# Patient Record
Sex: Male | Born: 1970 | Race: White | Hispanic: No | Marital: Single | Smoking: Never smoker
Health system: Southern US, Community
[De-identification: ages and names within clinical notes are randomized; demographics above are authoritative.]

## PROBLEM LIST (undated history)

## (undated) HISTORY — PX: WISDOM TOOTH EXTRACTION: SHX21

---

## 2017-04-23 ENCOUNTER — Encounter: Payer: Self-pay | Admitting: Emergency Medicine

## 2017-04-23 ENCOUNTER — Emergency Department: Payer: Self-pay

## 2017-04-23 ENCOUNTER — Emergency Department
Admission: EM | Admit: 2017-04-23 | Discharge: 2017-04-23 | Disposition: A | Discharge status: Home / Self Care | Payer: Self-pay | Attending: Emergency Medicine | Admitting: Emergency Medicine

## 2017-04-23 ENCOUNTER — Other Ambulatory Visit: Payer: Self-pay

## 2017-04-23 DIAGNOSIS — R112 Nausea with vomiting, unspecified: Secondary | ICD-10-CM | POA: Insufficient documentation

## 2017-04-23 DIAGNOSIS — R739 Hyperglycemia, unspecified: Secondary | ICD-10-CM | POA: Insufficient documentation

## 2017-04-23 DIAGNOSIS — K2900 Acute gastritis without bleeding: Secondary | ICD-10-CM | POA: Insufficient documentation

## 2017-04-23 DIAGNOSIS — R1010 Upper abdominal pain, unspecified: Secondary | ICD-10-CM

## 2017-04-23 LAB — URINALYSIS, COMPLETE (UACMP) WITH MICROSCOPIC
BACTERIA UA: NONE SEEN
Bilirubin Urine: NEGATIVE
Glucose, UA: 150 mg/dL — AB
Hgb urine dipstick: NEGATIVE
Ketones, ur: 80 mg/dL — AB
Leukocytes, UA: NEGATIVE
NITRITE: NEGATIVE
Protein, ur: 100 mg/dL — AB
SPECIFIC GRAVITY, URINE: 1.034 — AB (ref 1.005–1.030)
pH: 5 (ref 5.0–8.0)

## 2017-04-23 LAB — COMPREHENSIVE METABOLIC PANEL
ALBUMIN: 4.4 g/dL (ref 3.5–5.0)
ALK PHOS: 71 U/L (ref 38–126)
ALT: 36 U/L (ref 17–63)
AST: 29 U/L (ref 15–41)
Anion gap: 15 (ref 5–15)
BILIRUBIN TOTAL: 1.4 mg/dL — AB (ref 0.3–1.2)
BUN: 25 mg/dL — AB (ref 6–20)
CO2: 25 mmol/L (ref 22–32)
CREATININE: 1 mg/dL (ref 0.61–1.24)
Calcium: 9.6 mg/dL (ref 8.9–10.3)
Chloride: 98 mmol/L — ABNORMAL LOW (ref 101–111)
GFR calc Af Amer: >60 mL/min (ref >60)
GFR calc non Af Amer: >60 mL/min (ref >60)
GLUCOSE: 225 mg/dL — AB (ref 65–99)
POTASSIUM: 4 mmol/L (ref 3.5–5.1)
Sodium: 138 mmol/L (ref 135–145)
TOTAL PROTEIN: 8.1 g/dL (ref 6.5–8.1)

## 2017-04-23 LAB — CBC
HEMATOCRIT: 53 % — AB (ref 40.0–52.0)
Hemoglobin: 18.2 g/dL — ABNORMAL HIGH (ref 13.0–18.0)
MCH: 30.6 pg (ref 26.0–34.0)
MCHC: 34.3 g/dL (ref 32.0–36.0)
MCV: 89.1 fL (ref 80.0–100.0)
Platelets: 247 10*3/uL (ref 150–440)
RBC: 5.94 MIL/uL — ABNORMAL HIGH (ref 4.40–5.90)
RDW: 12.9 % (ref 11.5–14.5)
WBC: 12.7 10*3/uL — ABNORMAL HIGH (ref 3.8–10.6)

## 2017-04-23 LAB — LIPASE, BLOOD: Lipase: 35 U/L (ref 11–51)

## 2017-04-23 LAB — TROPONIN I

## 2017-04-23 MED ORDER — GI COCKTAIL ~~LOC~~
30.0000 mL | Freq: Once | ORAL | Status: AC
  Administered 2017-04-23: 30 mL via ORAL
  Filled 2017-04-23: qty 30

## 2017-04-23 MED ORDER — ONDANSETRON HCL 4 MG/2ML IJ SOLN
4.0000 mg | Freq: Once | INTRAMUSCULAR | Status: AC
  Administered 2017-04-23: 4 mg via INTRAVENOUS
  Filled 2017-04-23: qty 2

## 2017-04-23 MED ORDER — SODIUM CHLORIDE 0.9 % IV BOLUS
1000.0000 mL | Freq: Once | INTRAVENOUS | Status: AC
  Administered 2017-04-23: 1000 mL via INTRAVENOUS

## 2017-04-23 MED ORDER — IOPAMIDOL (ISOVUE-300) INJECTION 61%
30.0000 mL | Freq: Once | INTRAVENOUS | Status: AC
  Administered 2017-04-23: 30 mL via ORAL

## 2017-04-23 MED ORDER — PANTOPRAZOLE SODIUM 40 MG IV SOLR
40.0000 mg | Freq: Once | INTRAVENOUS | Status: AC
  Administered 2017-04-23: 40 mg via INTRAVENOUS
  Filled 2017-04-23: qty 40

## 2017-04-23 MED ORDER — IOPAMIDOL (ISOVUE-300) INJECTION 61%
100.0000 mL | Freq: Once | INTRAVENOUS | Status: AC | PRN
  Administered 2017-04-23: 100 mL via INTRAVENOUS

## 2017-04-23 MED ORDER — SUCRALFATE 1 G PO TABS: 1.0000 g | ORAL_TABLET | Freq: Four times a day (QID) | ORAL | 0 refills | Status: AC

## 2017-04-23 NOTE — ED Notes (Signed)
Pt to the ER for pain in the middle of the abdomen. Worse when laying down. Pt induces vomiting to relieve pain. No nausea or pain at this time. Holding zofran unless pt becomes nauseated. Urine is dark in color.

## 2017-04-23 NOTE — ED Notes (Signed)
Patient transported to CT 

## 2017-04-23 NOTE — ED Provider Notes (Signed)
[facility] Emergency Department Provider Note  ___________________________________________   First MD Initiated Contact with Patient 04/23/17 1915     (approximate)  I have reviewed the triage vital signs and the nursing notes.   HISTORY  Chief Complaint Abdominal Pain   HPI Bill Mcneil is a 47 y.o. male without any chronic medical conditions was presenting to the emergency department with 2-3 weeks of burning upper abdominal pain.  He says that since Friday though it is gotten much worse.  He states that he has not been able to eat anything since this past Friday needs to vomit about every 45 minutes in order to relieve the pressure he feels to his upper abdomen.  He says that he has been taking Nexium over the past 2-3 weeks without relief.  He says that he has been taking it every morning.  He says that he is also not had a bowel movement since this past Saturday but is passing gas as of today.  Says that he had a similar episode about 6 months ago but it resolved after about 1 week.  Says that he has not seen a doctor in years and does not take any medication at home.  Drinks alcohol occasionally but not over the past 2-3 weeks.  History reviewed. No pertinent past medical history.  There are no active problems to display for this patient.   Past Surgical History:  Procedure Laterality Date  . WISDOM TOOTH EXTRACTION      Prior to Admission medications   Not on File    Allergies Patient has no known allergies.  History reviewed. No pertinent family history.  Social History Social History   Tobacco Use  . Smoking status: Never Smoker  . Smokeless tobacco: Never Used  Substance Use Topics  . Alcohol use: Yes    Comment: twice a month  . Drug use: Never    Review of Systems  Constitutional: No fever/chills Eyes: No visual changes. ENT: No sore throat. Cardiovascular: Denies chest pain. Respiratory: Denies shortness of  breath. Gastrointestinal:  No diarrhea.  No constipation. Genitourinary: Negative for dysuria. Musculoskeletal: Negative for back pain. Skin: Negative for rash. Neurological: Negative for headaches, focal weakness or numbness.   ____________________________________________   PHYSICAL EXAM:  VITAL SIGNS: ED Triage Vitals [04/23/17 1716]  Enc Vitals Group     BP (!) 142/92     Pulse Rate (!) 112     Resp 18     Temp      Temp src      SpO2 98 %     Weight 195 lb (88.5 kg)     Height 5\' 10"  (1.778 m)     Head Circumference      Peak Flow      Pain Score 5     Pain Loc      Pain Edu?      Excl. in GC?     Constitutional: Alert and oriented. Well appearing and in no acute distress. Eyes: Conjunctivae are normal.  Head: Atraumatic. Nose: No congestion/rhinnorhea. Mouth/Throat: Mucous membranes are moist.  Neck: No stridor.   Cardiovascular: Normal rate, regular rhythm. Grossly normal heart sounds.  Respiratory: Normal respiratory effort.  No retractions. Lungs CTAB. Gastrointestinal: Soft and nontender. No distention. No CVA tenderness. Musculoskeletal: No lower extremity tenderness nor edema.  No joint effusions. Neurologic:  Normal speech and language. No gross focal neurologic deficits are appreciated. Skin:  Skin is warm, dry and intact. No rash  noted. Psychiatric: Mood and affect are normal. Speech and behavior are normal.  ____________________________________________   LABS (all labs ordered are listed, but only abnormal results are displayed)  Labs Reviewed  COMPREHENSIVE METABOLIC PANEL - Abnormal; Notable for the following components:      Result Value   Chloride 98 (*)    Glucose, Bld 225 (*)    BUN 25 (*)    Total Bilirubin 1.4 (*)    All other components within normal limits  CBC - Abnormal; Notable for the following components:   WBC 12.7 (*)    RBC 5.94 (*)    Hemoglobin 18.2 (*)    HCT 53.0 (*)    All other components within normal limits   URINALYSIS, COMPLETE (UACMP) WITH MICROSCOPIC - Abnormal; Notable for the following components:   Color, Urine AMBER (*)    APPearance HAZY (*)    Specific Gravity, Urine 1.034 (*)    Glucose, UA 150 (*)    Ketones, ur 80 (*)    Protein, ur 100 (*)    Squamous Epithelial / LPF 0-5 (*)    All other components within normal limits  LIPASE, BLOOD  TROPONIN I   ____________________________________________  EKG  ED ECG REPORT I, Arelia Longest, the attending physician, personally viewed and interpreted this ECG.   Date: 04/23/2017  EKG Time: 1716  Rate: 114  Rhythm: sinus tachycardia  Axis: Normal  Intervals:none  ST&T Change: No ST segment elevation or depression.  No abnormal T wave inversion.  ____________________________________________  RADIOLOGY  Slightly enlarged proximal appendix with more normal-appearing distal caliber without surrounding inflammatory changes to suggest appendicitis.  Hepatic steatosis with fat sparing near the gallbladder fossa ____________________________________________   PROCEDURES  Procedure(s) performed:   Procedures  Critical Care performed:   ____________________________________________   INITIAL IMPRESSION / ASSESSMENT AND PLAN / ED COURSE  Pertinent labs & imaging results that were available during my care of the patient were reviewed by me and considered in my medical decision making (see chart for details).  Differential diagnosis includes, but is not limited to, biliary disease (biliary colic, acute cholecystitis, cholangitis, choledocholithiasis, etc), intrathoracic causes for epigastric abdominal pain including ACS, gastritis, duodenitis, pancreatitis, small bowel or large bowel obstruction, abdominal aortic aneurysm, hernia, and gastritis. As part of my medical decision making, I reviewed the following data within the electronic MEDICAL RECORD NUMBERPatient without outpatient primary care.     ----------------------------------------- 10:51 PM on 04/23/2017 -----------------------------------------  Patient tolerated GI cocktail very well and says that he feels much improved after this medication.  Continues to be pain-free throughout.  I did discuss the case with Dr. Aleen Campi including the radiology report he says that there is unlikely to be any indication of acute appendicitis especially since the patient is nontender throughout.  Patient also reporting now that he took 800 mg of ibuprofen daily 3 days last week leading up to this issue.  Possible gastritis versus peptic ulcer disease.  Patient will continue his Nexium.  I will discharge him with sucralfate.  He will use Maalox or Mylanta as needed.  He will also be given follow-up with gastroneurology.  I am also concerned that he does not of primary care and has a glucose of 225.  Although this is not a formal fasting glucose I believe that he will need further workup including screening for diabetes.  Will be given references for several clinics in the area.  I discussed with him the importance of following up for regular  health screening especially since he is now getting into his late 69s.  He is understanding of this plan and willing to comply. ____________________________________________   FINAL CLINICAL IMPRESSION(S) / ED DIAGNOSES  Gastritis.  Nausea and vomiting.  Hyperglycemia.  Abdominal pain.    NEW MEDICATIONS STARTED DURING THIS VISIT:  New Prescriptions   No medications on file     Note:  This document was prepared using Dragon voice recognition software and may include unintentional dictation errors.     Myrna Blazer, MD 04/23/17 [phone]

## 2017-04-23 NOTE — ED Triage Notes (Signed)
Pt to ED c/o upper mid abd pain that is aching x3 days and only relief is when he makes himself vomit, vomited x10 today.  Passing gas, last BM Saturday, usually goes once a day.  Takes nexium 24 at home.

## 2017-04-26 ENCOUNTER — Telehealth: Payer: Self-pay | Admitting: Internal Medicine

## 2017-04-26 ENCOUNTER — Emergency Department
Admission: EM | Admit: 2017-04-26 | Discharge: 2017-04-26 | Disposition: A | Discharge status: Home / Self Care | Payer: Self-pay | Attending: Emergency Medicine | Admitting: Emergency Medicine

## 2017-04-26 ENCOUNTER — Encounter: Payer: Self-pay | Admitting: Emergency Medicine

## 2017-04-26 ENCOUNTER — Other Ambulatory Visit: Payer: Self-pay

## 2017-04-26 DIAGNOSIS — R1013 Epigastric pain: Secondary | ICD-10-CM

## 2017-04-26 DIAGNOSIS — K2970 Gastritis, unspecified, without bleeding: Secondary | ICD-10-CM | POA: Insufficient documentation

## 2017-04-26 LAB — CBC
HEMATOCRIT: 51.3 % (ref 40.0–52.0)
HEMOGLOBIN: 17.6 g/dL (ref 13.0–18.0)
MCH: 30.4 pg (ref 26.0–34.0)
MCHC: 34.4 g/dL (ref 32.0–36.0)
MCV: 88.4 fL (ref 80.0–100.0)
Platelets: 239 10*3/uL (ref 150–440)
RBC: 5.8 MIL/uL (ref 4.40–5.90)
RDW: 13.2 % (ref 11.5–14.5)
WBC: 12.8 10*3/uL — ABNORMAL HIGH (ref 3.8–10.6)

## 2017-04-26 LAB — COMPREHENSIVE METABOLIC PANEL
ALBUMIN: 4.1 g/dL (ref 3.5–5.0)
ALT: 32 U/L (ref 17–63)
ANION GAP: 10 (ref 5–15)
AST: 26 U/L (ref 15–41)
Alkaline Phosphatase: 58 U/L (ref 38–126)
BUN: 20 mg/dL (ref 6–20)
CHLORIDE: 99 mmol/L — AB (ref 101–111)
CO2: 27 mmol/L (ref 22–32)
Calcium: 9.1 mg/dL (ref 8.9–10.3)
Creatinine, Ser: 0.95 mg/dL (ref 0.61–1.24)
GFR calc Af Amer: >60 mL/min (ref >60)
GFR calc non Af Amer: >60 mL/min (ref >60)
GLUCOSE: 293 mg/dL — AB (ref 65–99)
POTASSIUM: 3.7 mmol/L (ref 3.5–5.1)
Sodium: 136 mmol/L (ref 135–145)
Total Bilirubin: 1 mg/dL (ref 0.3–1.2)
Total Protein: 7.5 g/dL (ref 6.5–8.1)

## 2017-04-26 LAB — LIPASE, BLOOD: LIPASE: 49 U/L (ref 11–51)

## 2017-04-26 MED ORDER — SODIUM CHLORIDE 0.9 % IV BOLUS
1000.0000 mL | Freq: Once | INTRAVENOUS | Status: AC
  Administered 2017-04-26: 1000 mL via INTRAVENOUS

## 2017-04-26 MED ORDER — PANTOPRAZOLE SODIUM 40 MG PO TBEC: 40.0000 mg | DELAYED_RELEASE_TABLET | Freq: Every day | ORAL | 1 refills | Status: AC

## 2017-04-26 MED ORDER — PANTOPRAZOLE SODIUM 40 MG PO TBEC
40.0000 mg | DELAYED_RELEASE_TABLET | Freq: Every day | ORAL | Status: DC
  Administered 2017-04-26: 40 mg via ORAL
  Filled 2017-04-26: qty 1

## 2017-04-26 MED ORDER — ONDANSETRON 4 MG PO TBDP: 4.0000 mg | ORAL_TABLET | Freq: Three times a day (TID) | ORAL | 0 refills | Status: AC | PRN

## 2017-04-26 MED ORDER — GI COCKTAIL ~~LOC~~
30.0000 mL | Freq: Once | ORAL | Status: AC
  Administered 2017-04-26: 30 mL via ORAL
  Filled 2017-04-26: qty 30

## 2017-04-26 NOTE — Telephone Encounter (Signed)
Patient's mother called to the hospital operator to discuss the patient's case with Dr. Maximino Greenlandahiliani, who will be patient's consultant after ED visit on 04/23/2017 referral. Being Friday afternoon, Dr. Maximino Greenlandahiliani is not available and I am the GI physician on call.  Patient has not yet seen Dr. Maximino Greenlandahiliani in the office nor in the hospital. The mother of the patient describes patient with intractable nausea and vomiting "unable to keep any food or liquids down".  NO reported bleeding. I referred the patient and his mother to the [facility] ED as soon as possible for re-evaluation and treatment. The mother was told to keep the patient's initial consultation appointment with Dr. Maximino Greenlandahiliani as scheduled otherwise.

## 2017-04-26 NOTE — ED Provider Notes (Signed)
[facility] Emergency Department Provider Note  Time seen: 7:47 PM  I have reviewed the triage vital signs and the nursing notes.   HISTORY  Chief Complaint Abdominal Pain    HPI Bill Mcneil is a 47 y.o. male with no significant past medical history presents to the emergency department for upper abdominal discomfort.  According to the patient for the past 1-2 weeks he has been experiencing upper abdominal pain.  Describes it as a stabbing type pain, worse when eating food, somewhat relieved after vomiting.  States nausea, states he only vomits when he makes himself vomit to help with the abdominal discomfort.  States he had 2 bowel movements today and felt considerably better after having the bowel movements.  Denies any fever.  Patient states he had similar episode back in November that lasted several days and then resolved.  Has never seen GI medicine has never had an endoscopy.  Patient was seen in the emergency department for the same 04/23/17.  At that time patient's workup was largely reassuring including an overall negative CT scan.   History reviewed. No pertinent past medical history.  There are no active problems to display for this patient.   Past Surgical History:  Procedure Laterality Date  . WISDOM TOOTH EXTRACTION      Prior to Admission medications   Medication Sig Start Date End Date Taking? Authorizing Provider  sucralfate (CARAFATE) 1 g tablet Take 1 tablet (1 g total) by mouth 4 (four) times daily for 15 doses. 04/23/17 04/27/17  Schaevitz, Myra Rude, MD    No Known Allergies  No family history on file.  Social History Social History   Tobacco Use  . Smoking status: Never Smoker  . Smokeless tobacco: Never Used  Substance Use Topics  . Alcohol use: Yes    Comment: twice a month  . Drug use: Never    Review of Systems Constitutional: Negative for fever. Eyes: Negative for visual complaints ENT: Negative for recent  illness/congestion Cardiovascular: Negative for chest pain. Respiratory: Negative for shortness of breath. Gastrointestinal: Moderate upper abdominal pain, mild currently. Genitourinary: Negative for urinary compaints Musculoskeletal: Negative for musculoskeletal complaints Skin: Negative for skin complaints  Neurological: Negative for headache All other ROS negative  ____________________________________________   PHYSICAL EXAM:  VITAL SIGNS: ED Triage Vitals  Enc Vitals Group     BP 04/26/17 1811 120/83     Pulse Rate 04/26/17 1811 (!) 109     Resp 04/26/17 1811 16     Temp 04/26/17 1811 99.4 F (37.4 C)     Temp Source 04/26/17 1811 Oral     SpO2 04/26/17 1811 99 %     Weight 04/26/17 1812 195 lb (88.5 kg)     Height 04/26/17 1812 5\' 10"  (1.778 m)     Head Circumference --      Peak Flow --      Pain Score 04/26/17 1830 4     Pain Loc --      Pain Edu? --      Excl. in GC? --     Constitutional: Alert and oriented. Well appearing and in no distress. Eyes: Normal exam ENT   Head: Normocephalic and atraumatic   Mouth/Throat: Mucous membranes are moist. Cardiovascular: Normal rate, regular rhythm. No murmur Respiratory: Normal respiratory effort without tachypnea nor retractions. Breath sounds are clear  Gastrointestinal: Soft, mild epigastric tenderness palpation.  No rebound or guarding.  No distention. Musculoskeletal: Nontender with normal range of motion in  all extremities. Neurologic:  Normal speech and language. No gross focal neurologic deficits Skin:  Skin is warm, dry and intact.  Psychiatric: Mood and affect are normal.   ____________________________________________    EKG  EKG reviewed and interpreted by myself shows normal sinus rhythm at 99 bpm, narrow QRS, normal axis, normal intervals, nonspecific ST changes without ST elevation.  ____________________________________________     INITIAL IMPRESSION / ASSESSMENT AND PLAN / ED  COURSE  Pertinent labs & imaging results that were available during my care of the patient were reviewed by me and considered in my medical decision making (see chart for details).  She presents emergency department for upper abdominal pain, worse after eating.  Differential would include gallbladder disease, gastritis, gastric or peptic ulcers, esophagitis.  Patient states he started taking Carafate yesterday but continued to have pain today, states he was very nauseated and could not drink fluids due to the nausea sensation so he did not drink anything today, feels dehydrated.  I reviewed the patient's workup from several days ago including a CT scan that was overall negative.  Patient has no lower abdominal discomfort all of his discomfort is in the epigastrium.  States is considerably relieved after having a bowel movement earlier today.  Patient has a slight leukocytosis, but largely unchanged from 2 days ago.  Overall believe the patient symptoms are most suggestive of gastritis.  I discussed with the patient the need to follow-up with GI medicine.  They actually called today to arrange for a follow-up appointment.  Patient is currently taking Carafate, we will add Protonix.  I discussed return precautions.  Will also discharged with a nausea medication.  Patient agreeable to this plan of care.  ____________________________________________   FINAL CLINICAL IMPRESSION(S) / ED DIAGNOSES  Kathrin PennerGastritis    Menaal Russum, MD 04/26/17 [phone]

## 2017-04-26 NOTE — ED Triage Notes (Signed)
Patient presents to the ED with epigastric pain x 1 week.  Patient reports constipation since Saturday and nausea and vomiting.  Patient was seen on Tuesday for the same.  Patient states, "the last meal I ate was Friday.  I think I'm probably dehydrated."  Patient reports vomiting x 3 in the past 24 hours.

## 2017-04-26 NOTE — ED Notes (Signed)
Pt reports 2 episodes of diarrhea since being at the ED, pt c/o pain in peri-umbilicus area since last visit Tuesday, pain relief with self induced vomiting

## 2017-04-26 NOTE — ED Notes (Signed)
ED Provider at bedside. 

## 2017-04-26 NOTE — ED Triage Notes (Signed)
FIRST NURSE NOTE-here for epigastric pain. Was here for same 2 nights ago. NAD.

## 2017-04-26 NOTE — Discharge Instructions (Addendum)
Please follow-up with GI medicine as soon as possible for further evaluation and consideration of endoscopy.  Return to the emergency department for any acute worsening of pain, or any other symptom personally concerning to yourself.

## 2017-05-29 ENCOUNTER — Ambulatory Visit: Payer: Self-pay | Admitting: Gastroenterology

## 2017-07-10 ENCOUNTER — Ambulatory Visit: Payer: Self-pay | Admitting: Gastroenterology

## 2018-01-22 ENCOUNTER — Other Ambulatory Visit: Payer: Self-pay

## 2018-01-22 DIAGNOSIS — J111 Influenza due to unidentified influenza virus with other respiratory manifestations: Secondary | ICD-10-CM | POA: Insufficient documentation

## 2018-01-22 DIAGNOSIS — E119 Type 2 diabetes mellitus without complications: Secondary | ICD-10-CM | POA: Insufficient documentation

## 2018-01-22 NOTE — ED Triage Notes (Addendum)
Pt arrives to ED via POV from home with c/o dizziness and lightheadedness since Saturday. Pt states s/x's started with cough, nausea, and vomiting and has been unable to tolerate PO intake. Pt denies CP or SHOB, no abdominal pain, unsure of fever.

## 2018-01-23 ENCOUNTER — Emergency Department
Admission: EM | Admit: 2018-01-23 | Discharge: 2018-01-23 | Disposition: A | Discharge status: Home / Self Care | Payer: Self-pay | Attending: Emergency Medicine | Admitting: Emergency Medicine

## 2018-01-23 ENCOUNTER — Emergency Department: Payer: Self-pay

## 2018-01-23 DIAGNOSIS — J101 Influenza due to other identified influenza virus with other respiratory manifestations: Secondary | ICD-10-CM

## 2018-01-23 DIAGNOSIS — E1169 Type 2 diabetes mellitus with other specified complication: Secondary | ICD-10-CM

## 2018-01-23 LAB — CBC
HCT: 54.4 % — ABNORMAL HIGH (ref 39.0–52.0)
Hemoglobin: 19.3 g/dL — ABNORMAL HIGH (ref 13.0–17.0)
MCH: 30.3 pg (ref 26.0–34.0)
MCHC: 35.5 g/dL (ref 30.0–36.0)
MCV: 85.3 fL (ref 80.0–100.0)
PLATELETS: 138 10*3/uL — AB (ref 150–400)
RBC: 6.38 MIL/uL — AB (ref 4.22–5.81)
RDW: 11.7 % (ref 11.5–15.5)
WBC: 6.2 10*3/uL (ref 4.0–10.5)
nRBC: 0 % (ref 0.0–0.2)

## 2018-01-23 LAB — BASIC METABOLIC PANEL
ANION GAP: 13 (ref 5–15)
BUN: 19 mg/dL (ref 6–20)
CHLORIDE: 92 mmol/L — AB (ref 98–111)
CO2: 23 mmol/L (ref 22–32)
Calcium: 8.6 mg/dL — ABNORMAL LOW (ref 8.9–10.3)
Creatinine, Ser: 0.9 mg/dL (ref 0.61–1.24)
GFR calc Af Amer: >60 mL/min (ref >60)
GLUCOSE: 284 mg/dL — AB (ref 70–99)
Potassium: 4.1 mmol/L (ref 3.5–5.1)
Sodium: 128 mmol/L — ABNORMAL LOW (ref 135–145)

## 2018-01-23 LAB — GLUCOSE, CAPILLARY: Glucose-Capillary: 190 mg/dL — ABNORMAL HIGH (ref 70–99)

## 2018-01-23 LAB — URINALYSIS, COMPLETE (UACMP) WITH MICROSCOPIC
BILIRUBIN URINE: NEGATIVE
Bacteria, UA: NONE SEEN
Glucose, UA: >=500 mg/dL — AB
HGB URINE DIPSTICK: NEGATIVE
Ketones, ur: 80 mg/dL — AB
LEUKOCYTES UA: NEGATIVE
Nitrite: NEGATIVE
Protein, ur: >=300 mg/dL — AB
SPECIFIC GRAVITY, URINE: 1.039 — AB (ref 1.005–1.030)
pH: 5 (ref 5.0–8.0)

## 2018-01-23 LAB — BLOOD GAS, VENOUS
ACID-BASE EXCESS: 2 mmol/L (ref 0.0–2.0)
Bicarbonate: 27.8 mmol/L (ref 20.0–28.0)
O2 SAT: 43.7 %
PCO2 VEN: 47 mmHg (ref 44.0–60.0)
Patient temperature: 37
pH, Ven: 7.38 (ref 7.250–7.430)
pO2, Ven: <31 mmHg — CL (ref 32.0–45.0)

## 2018-01-23 LAB — HEMOGLOBIN A1C
HEMOGLOBIN A1C: 11.5 % — AB (ref 4.8–5.6)
Mean Plasma Glucose: 283.35 mg/dL

## 2018-01-23 LAB — CK: Total CK: 71 U/L (ref 49–397)

## 2018-01-23 LAB — INFLUENZA PANEL BY PCR (TYPE A & B)
INFLAPCR: POSITIVE — AB
Influenza B By PCR: NEGATIVE

## 2018-01-23 MED ORDER — METFORMIN HCL 500 MG PO TABS
500.0000 mg | ORAL_TABLET | Freq: Once | ORAL | Status: AC
  Administered 2018-01-23: 500 mg via ORAL
  Filled 2018-01-23: qty 1

## 2018-01-23 MED ORDER — SODIUM CHLORIDE 0.9 % IV BOLUS
1000.0000 mL | Freq: Once | INTRAVENOUS | Status: AC
  Administered 2018-01-23: 1000 mL via INTRAVENOUS

## 2018-01-23 MED ORDER — METFORMIN HCL 500 MG PO TABS: 500.0000 mg | ORAL_TABLET | Freq: Two times a day (BID) | ORAL | 0 refills | Status: AC

## 2018-01-23 NOTE — ED Provider Notes (Signed)
[facility] Emergency Department Provider Note   First MD Initiated Contact with Patient 01/23/18 0301     (approximate)  I have reviewed the triage vital signs and the nursing notes.   HISTORY  Chief Complaint Dizziness   HPI Bill Mcneil is a 48 y.o. male presents to the emergency department with subjective fevers cough nausea vomiting with dizziness/lightheadedness with position change since Saturday.  Patient does admit to increasing thirst.  Patient denies any abdominal pain.  Patient denies any chest pain or shortness of breath.   Past medical history None There are no active problems to display for this patient.   Past Surgical History:  Procedure Laterality Date  . WISDOM TOOTH EXTRACTION      Prior to Admission medications   Medication Sig Start Date End Date Taking? Authorizing Provider  metFORMIN (GLUCOPHAGE) 500 MG tablet Take 1 tablet (500 mg total) by mouth 2 (two) times daily with a meal. 01/23/18 01/23/19  Darci CurrentBrown, [place] N, MD  ondansetron (ZOFRAN ODT) 4 MG disintegrating tablet Take 1 tablet (4 mg total) by mouth every 8 (eight) hours as needed for nausea or vomiting. 04/26/17   Minna AntisPaduchowski, Kevin, MD  pantoprazole (PROTONIX) 40 MG tablet Take 1 tablet (40 mg total) by mouth daily. 04/26/17 04/26/18  Minna AntisPaduchowski, Kevin, MD  sucralfate (CARAFATE) 1 g tablet Take 1 tablet (1 g total) by mouth 4 (four) times daily for 15 doses. 04/23/17 04/27/17  Schaevitz, Myra Rudeavid Matthew, MD    Allergies No known drug allergies No family history on file.  Social History Social History   Tobacco Use  . Smoking status: Never Smoker  . Smokeless tobacco: Never Used  Substance Use Topics  . Alcohol use: Yes    Comment: twice a month  . Drug use: Never    Review of Systems Constitutional: Positive for fevers and chills. Eyes: No visual changes. ENT: No sore throat. Cardiovascular: Denies chest pain. Respiratory: Denies shortness of  breath. Gastrointestinal: Positive for nausea vomiting Genitourinary: Negative for dysuria. Musculoskeletal: Negative for neck pain.  Negative for back pain. Integumentary: Negative for rash. Neurological: Negative for headaches, focal weakness or numbness.  Positive for dizziness   ____________________________________________   PHYSICAL EXAM:  VITAL SIGNS: ED Triage Vitals  Enc Vitals Group     BP 01/22/18 2333 133/89     Pulse Rate 01/22/18 2333 (!) 115     Resp 01/22/18 2333 18     Temp 01/22/18 2333 98.9 F (37.2 C)     Temp Source 01/22/18 2333 Oral     SpO2 01/22/18 2333 97 %     Weight 01/22/18 2330 90.7 kg (200 lb)     Height 01/22/18 2330 1.778 m (5\' 10" )     Head Circumference --      Peak Flow --      Pain Score 01/22/18 2330 0     Pain Loc --      Pain Edu? --      Excl. in GC? --     Constitutional: Alert and oriented. Well appearing and in no acute distress. Eyes: Conjunctivae are normal. PERRL. EOMI. Mouth/Throat: Mucous membranes are dry.  Oropharynx non-erythematous. Neck: No stridor.   Cardiovascular: Normal rate, regular rhythm. Good peripheral circulation. Grossly normal heart sounds. Respiratory: Normal respiratory effort.  No retractions. Lungs CTAB. Gastrointestinal: Soft and nontender. No distention.  Musculoskeletal: No lower extremity tenderness nor edema. No gross deformities of extremities. Neurologic:  Normal speech and language. No gross focal neurologic  deficits are appreciated.  Skin:  Skin is warm, dry and intact. No rash noted. Psychiatric: Mood and affect are normal. Speech and behavior are normal.  ____________________________________________   LABS (all labs ordered are listed, but only abnormal results are displayed)  Labs Reviewed  BASIC METABOLIC PANEL - Abnormal; Notable for the following components:      Result Value   Sodium 128 (*)    Chloride 92 (*)    Glucose, Bld 284 (*)    Calcium 8.6 (*)    All other components  within normal limits  CBC - Abnormal; Notable for the following components:   RBC 6.38 (*)    Hemoglobin 19.3 (*)    HCT 54.4 (*)    Platelets 138 (*)    All other components within normal limits  URINALYSIS, COMPLETE (UACMP) WITH MICROSCOPIC - Abnormal; Notable for the following components:   Color, Urine AMBER (*)    APPearance HAZY (*)    Specific Gravity, Urine 1.039 (*)    Glucose, UA >=500 (*)    Ketones, ur 80 (*)    Protein, ur >=300 (*)    All other components within normal limits  INFLUENZA PANEL BY PCR (TYPE A & B) - Abnormal; Notable for the following components:   Influenza A By PCR POSITIVE (*)    All other components within normal limits  BLOOD GAS, VENOUS - Abnormal; Notable for the following components:   pO2, Ven <31.0 (*)    All other components within normal limits  HEMOGLOBIN A1C - Abnormal; Notable for the following components:   Hgb A1c MFr Bld 11.5 (*)    All other components within normal limits  GLUCOSE, CAPILLARY - Abnormal; Notable for the following components:   Glucose-Capillary 190 (*)    All other components within normal limits  CK  CBG MONITORING, ED   ____________________________________________  EKG  ED ECG REPORT I, Kenneth N Kathlean Cinco, the attending physician, personally viewed and interpreted this ECG.   Date: 01/22/2018  EKG Time: 11:45 PM  Rate: 114  Rhythm: Sinus tachycardia  Axis: Normal  Intervals: Normal  ST&T Change: None  ____________________________________________  RADIOLOGY I, [place] N Faustino Luecke, personally viewed and evaluated these images (plain radiographs) as part of my medical decision making, as well as reviewing the written report by the radiologist.  ED MD interpretation: Patchy peripheral midlung and basilar opacities concerning for mild pneumonia per radiologist.  Official radiology report(s): Dg Chest 2 View  Result Date: 01/23/2018 CLINICAL DATA:  Acute onset of dizziness and lightheadedness. Cough, nausea  and vomiting. EXAM: CHEST - 2 VIEW COMPARISON:  None. FINDINGS: The lungs are mildly hypoexpanded. Patchy peripheral left midlung and left basilar airspace opacities are concerning for mild pneumonia. There is no evidence of pleural effusion or pneumothorax. The heart is normal in size; the mediastinal contour is within normal limits. No acute osseous abnormalities are seen. IMPRESSION: Lungs mildly hypoexpanded. Patchy peripheral left midlung and left basilar airspace opacities are concerning for mild pneumonia. Electronically Signed   By: Roanna Raider M.D.   On: 01/23/2018 03:31     Procedures   ____________________________________________   INITIAL IMPRESSION / ASSESSMENT AND PLAN / ED COURSE  As part of my medical decision making, I reviewed the following data within the electronic MEDICAL RECORD NUMBER  48 year old male presenting with above-stated history and physical exam concerning for possible influenza for which the patient was positive _for influenza A.  Also concern for possible diabetes as reviewed the patient's chart  revealed persistently elevated glucose over 200.  Patient's glucose today 184.  Patient received 2 L IV normal saline and metformin.  Patient be prescribed metformin for home.  Spoke with the patient at length regarding management for home.  ____________________________  FINAL CLINICAL IMPRESSION(S) / ED DIAGNOSES  Final diagnoses:  Influenza A  Type 2 diabetes mellitus with other specified complication, without long-term current use of insulin (HCC)     MEDICATIONS GIVEN DURING THIS VISIT:  Medications  sodium chloride 0.9 % bolus 1,000 mL (0 mLs Intravenous Stopped 01/23/18 0435)  sodium chloride 0.9 % bolus 1,000 mL (0 mLs Intravenous Stopped 01/23/18 0400)  metFORMIN (GLUCOPHAGE) tablet 500 mg (500 mg Oral Given 01/23/18 0434)     ED Discharge Orders         Ordered    metFORMIN (GLUCOPHAGE) 500 MG tablet  2 times daily with meals     01/23/18 0537            Note:  This document was prepared using Dragon voice recognition software and may include unintentional dictation errors.    Darci CurrentBrown, [place] N, MD 01/23/18 [phone]

## 2018-01-27 ENCOUNTER — Telehealth: Payer: Self-pay | Admitting: Emergency Medicine

## 2018-01-27 NOTE — Telephone Encounter (Signed)
Called patient to inform of H1C result and ask about his follow up plans.  Left message.

## 2019-11-27 IMAGING — CT CT ABD-PELV W/ CM
2 of 5 series · 16 of 46 positions shown, 18 images · IV contrast (APPLIED)
Comparison: None.

CLINICAL DATA: Upper mid abdominal pain

EXAM:
CT ABDOMEN AND PELVIS WITH CONTRAST
TECHNIQUE: Multidetector CT imaging of the abdomen and pelvis was performed
using the standard protocol following bolus administration of
intravenous contrast.
CONTRAST:  100mL 10ULIA-088 IOPAMIDOL (10ULIA-088) INJECTION 61%

[Series 2: axial st · axial · 0.91mm/px · z∈[-996,-526]mm · 13 of 106 slices shown, 15 images]
[im 6/106  soft-tissue]
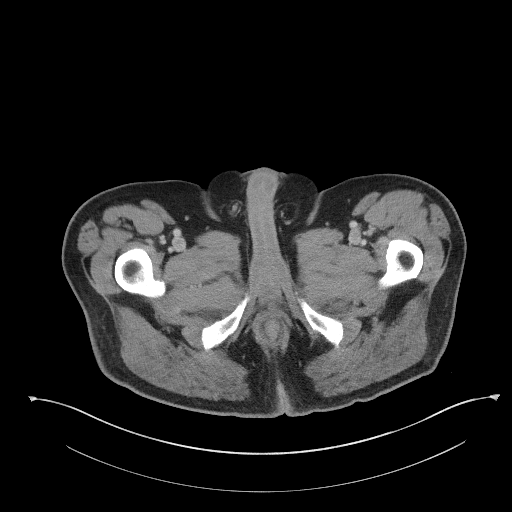
[im 6/106  bone]
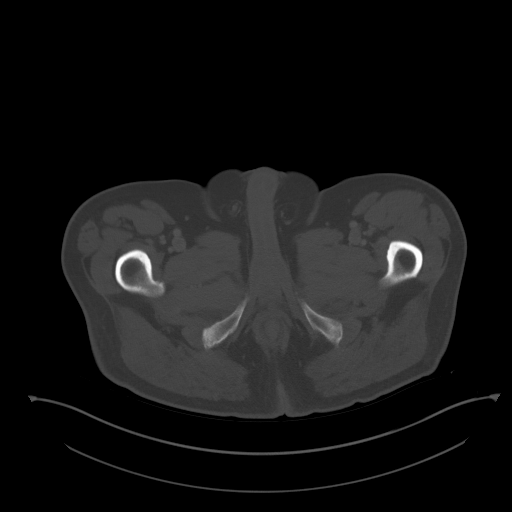
[im 16/106  soft-tissue]
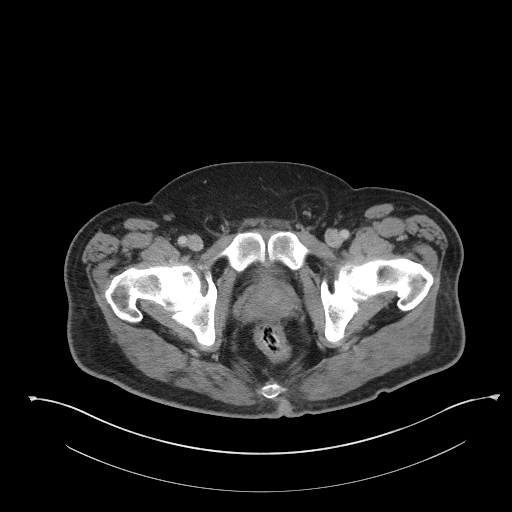
[im 22/106  soft-tissue]
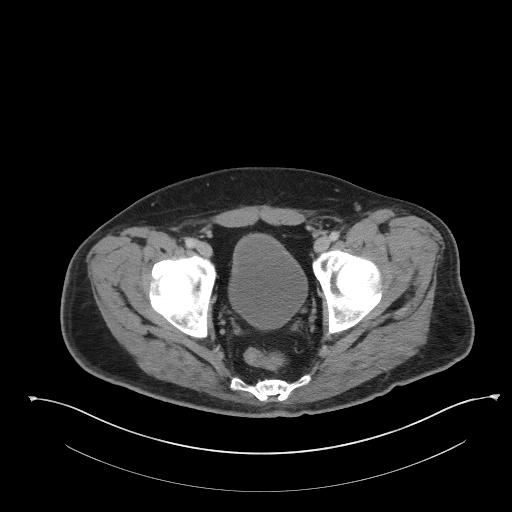
[im 32/106  soft-tissue]
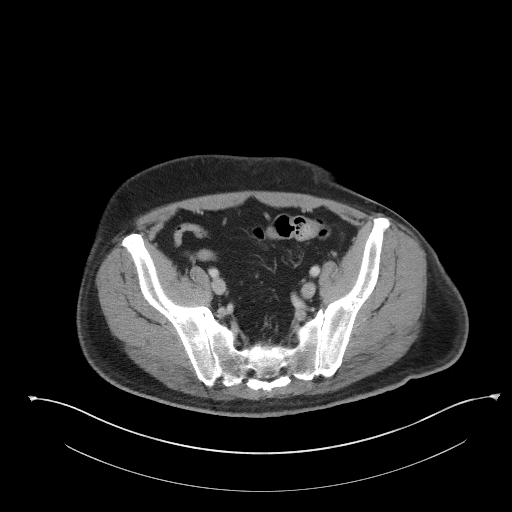
[im 37/106  soft-tissue]
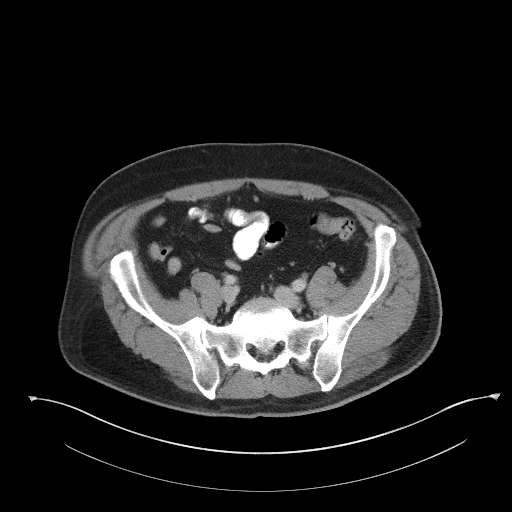
[im 48/106  soft-tissue]
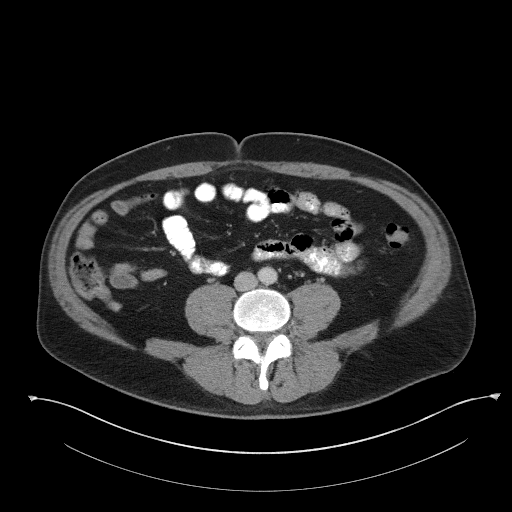
[im 53/106  soft-tissue]
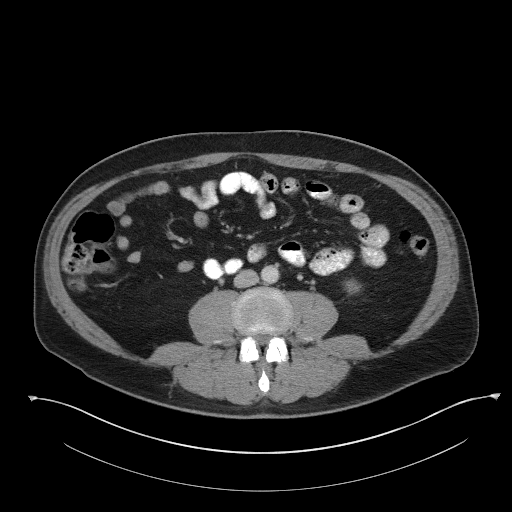
[im 58/106  soft-tissue]
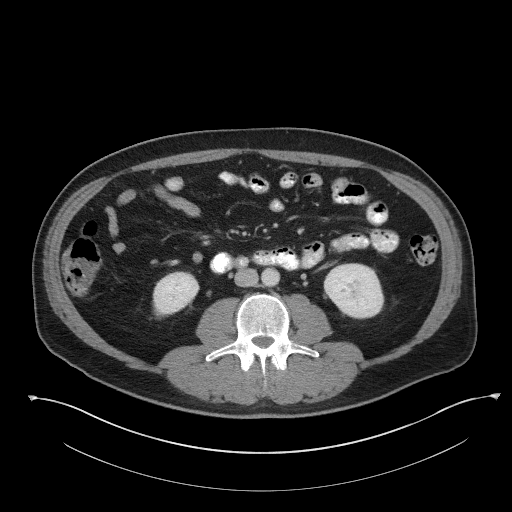
[im 69/106  soft-tissue]
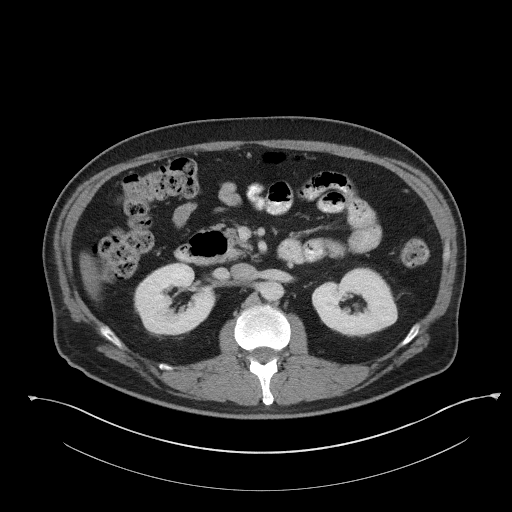
[im 69/106  bone]
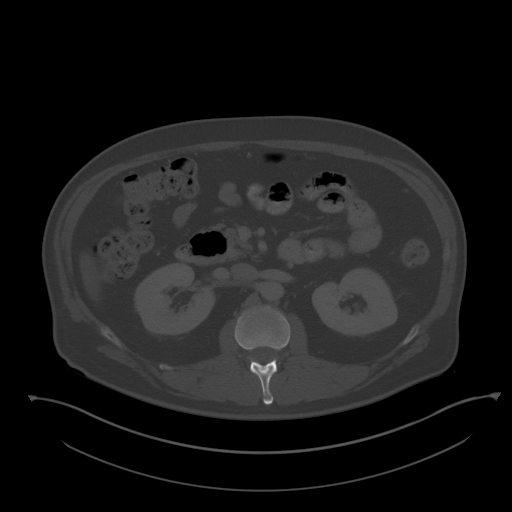
[im 74/106  soft-tissue]
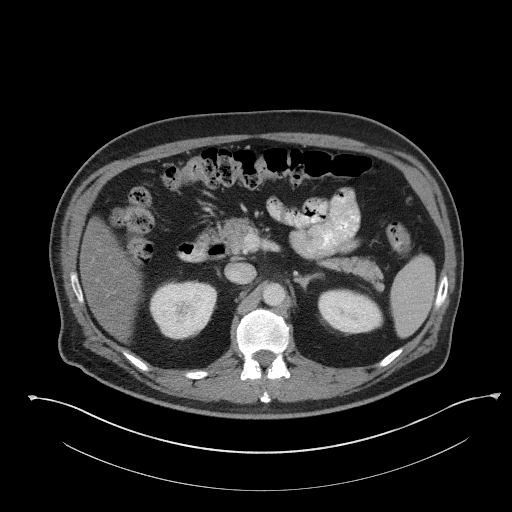
[im 85/106  soft-tissue]
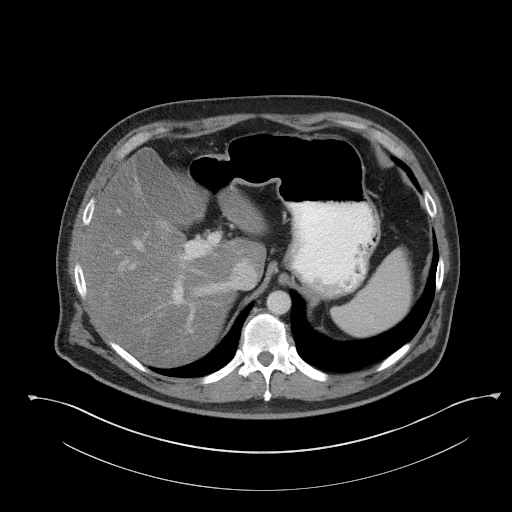
[im 90/106  soft-tissue]
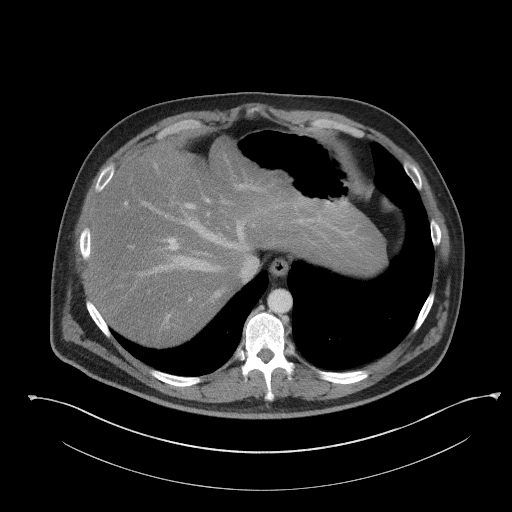
[im 100/106  soft-tissue]
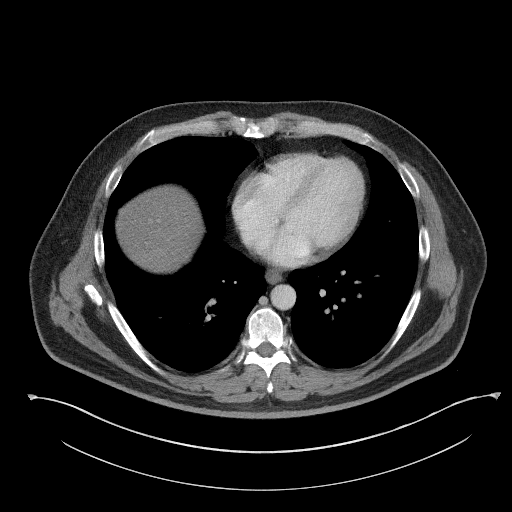

[Series 5: coronal st · coronal · 0.85mm/px · 3 of 97 slices shown]
[im 33/97  soft-tissue]
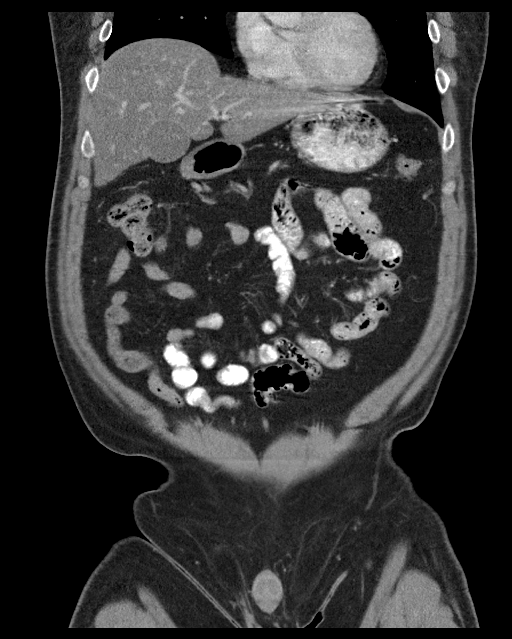
[im 43/97  soft-tissue]
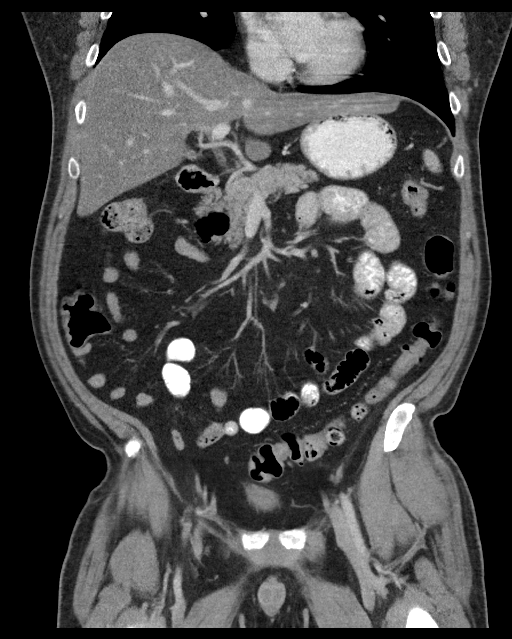
[im 54/97  soft-tissue]
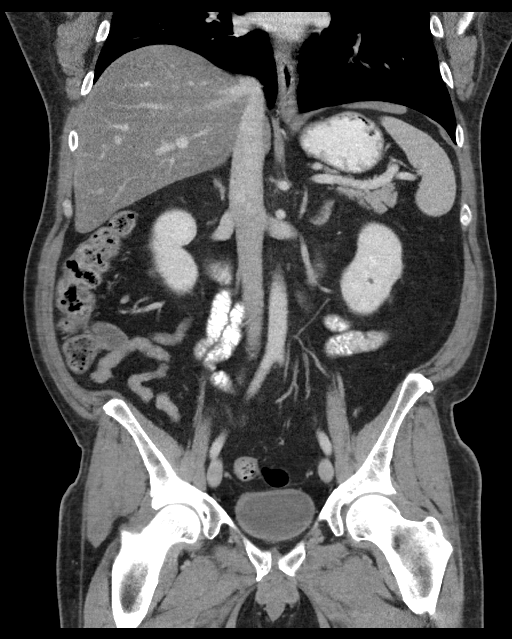

[16 of 46 positions shown; findings below may reference images not displayed]

FINDINGS: Lower chest: Lung bases demonstrate no acute consolidation or
pleural effusion. Normal heart size.

Hepatobiliary: Hepatic steatosis. No calcified gallstones. Fatty
sparing near the gallbladder fossa. No biliary dilatation

Pancreas: Unremarkable. No pancreatic ductal dilatation or
surrounding inflammatory changes.

Spleen: Normal in size without focal abnormality.

Adrenals/Urinary Tract: Adrenal glands are unremarkable. Kidneys are
normal, without renal calculi, focal lesion, or hydronephrosis.
Bladder is unremarkable.

Stomach/Bowel: Stomach is within normal limits. Proximal appendix
slightly enlarged, measuring up to 9 mm, distal appendix more normal
in caliber. No surrounding inflammatory changes. No evidence of
bowel wall thickening, distention, or inflammatory changes.

Vascular/Lymphatic: No significant vascular findings are present. No
enlarged abdominal or pelvic lymph nodes.

Reproductive: Prostate is unremarkable.

Other: Negative for free air or free fluid. Fat in the inguinal
canals bilaterally.

Musculoskeletal: No acute or significant osseous findings.
IMPRESSION: 1. Slightly enlarged proximal appendix with more normal appearing
distal caliber; no surrounding inflammatory changes to suggest
appendicitis.
2. Hepatic steatosis with fat sparing near the gallbladder fossa

## 2020-08-28 IMAGING — CR DG CHEST 2V
2 series · 2 of 2 positions shown · non-contrast
Comparison: None.

CLINICAL DATA: Acute onset of dizziness and lightheadedness. Cough,
nausea and vomiting.

EXAM:
CHEST - 2 VIEW

[chest lat]
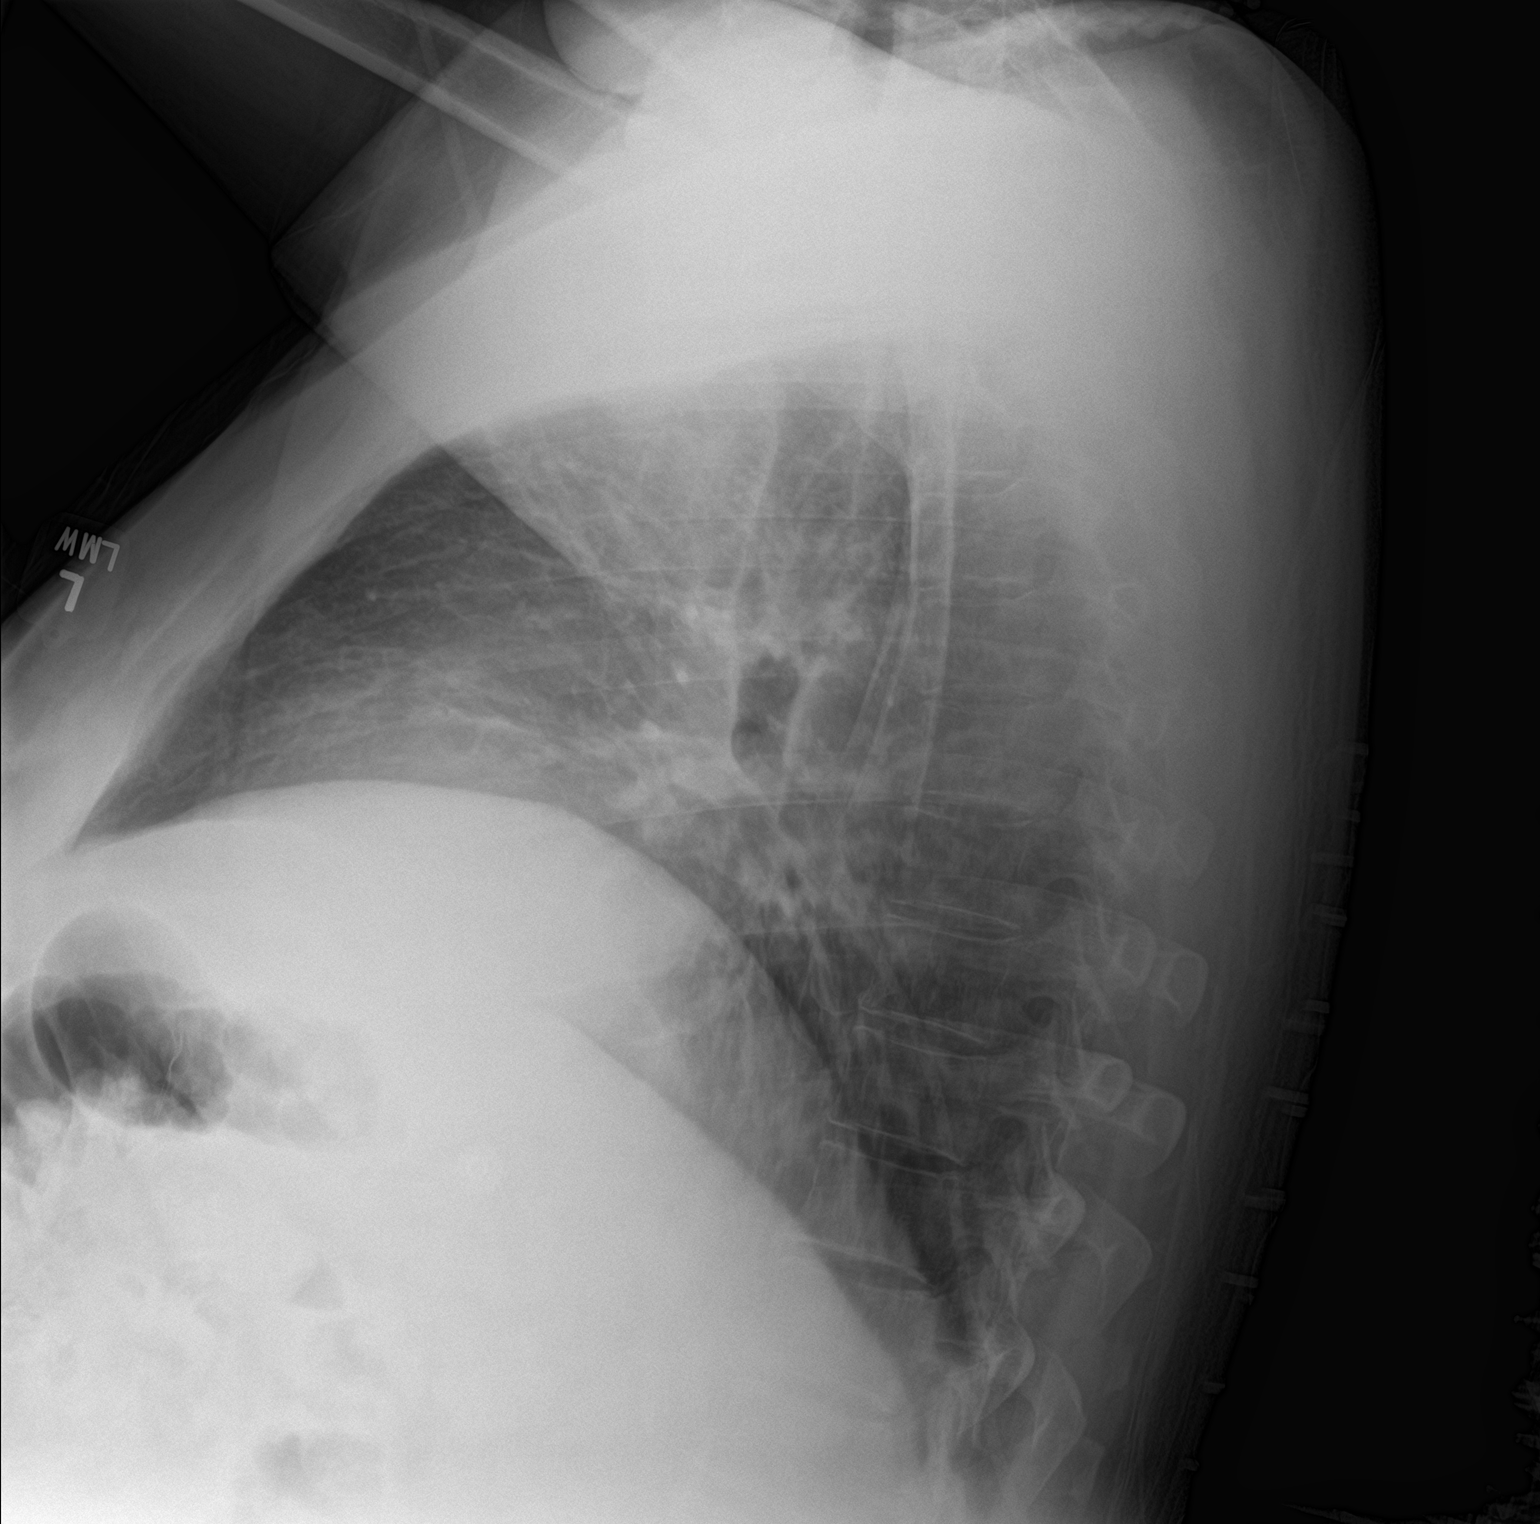

[chest ap]
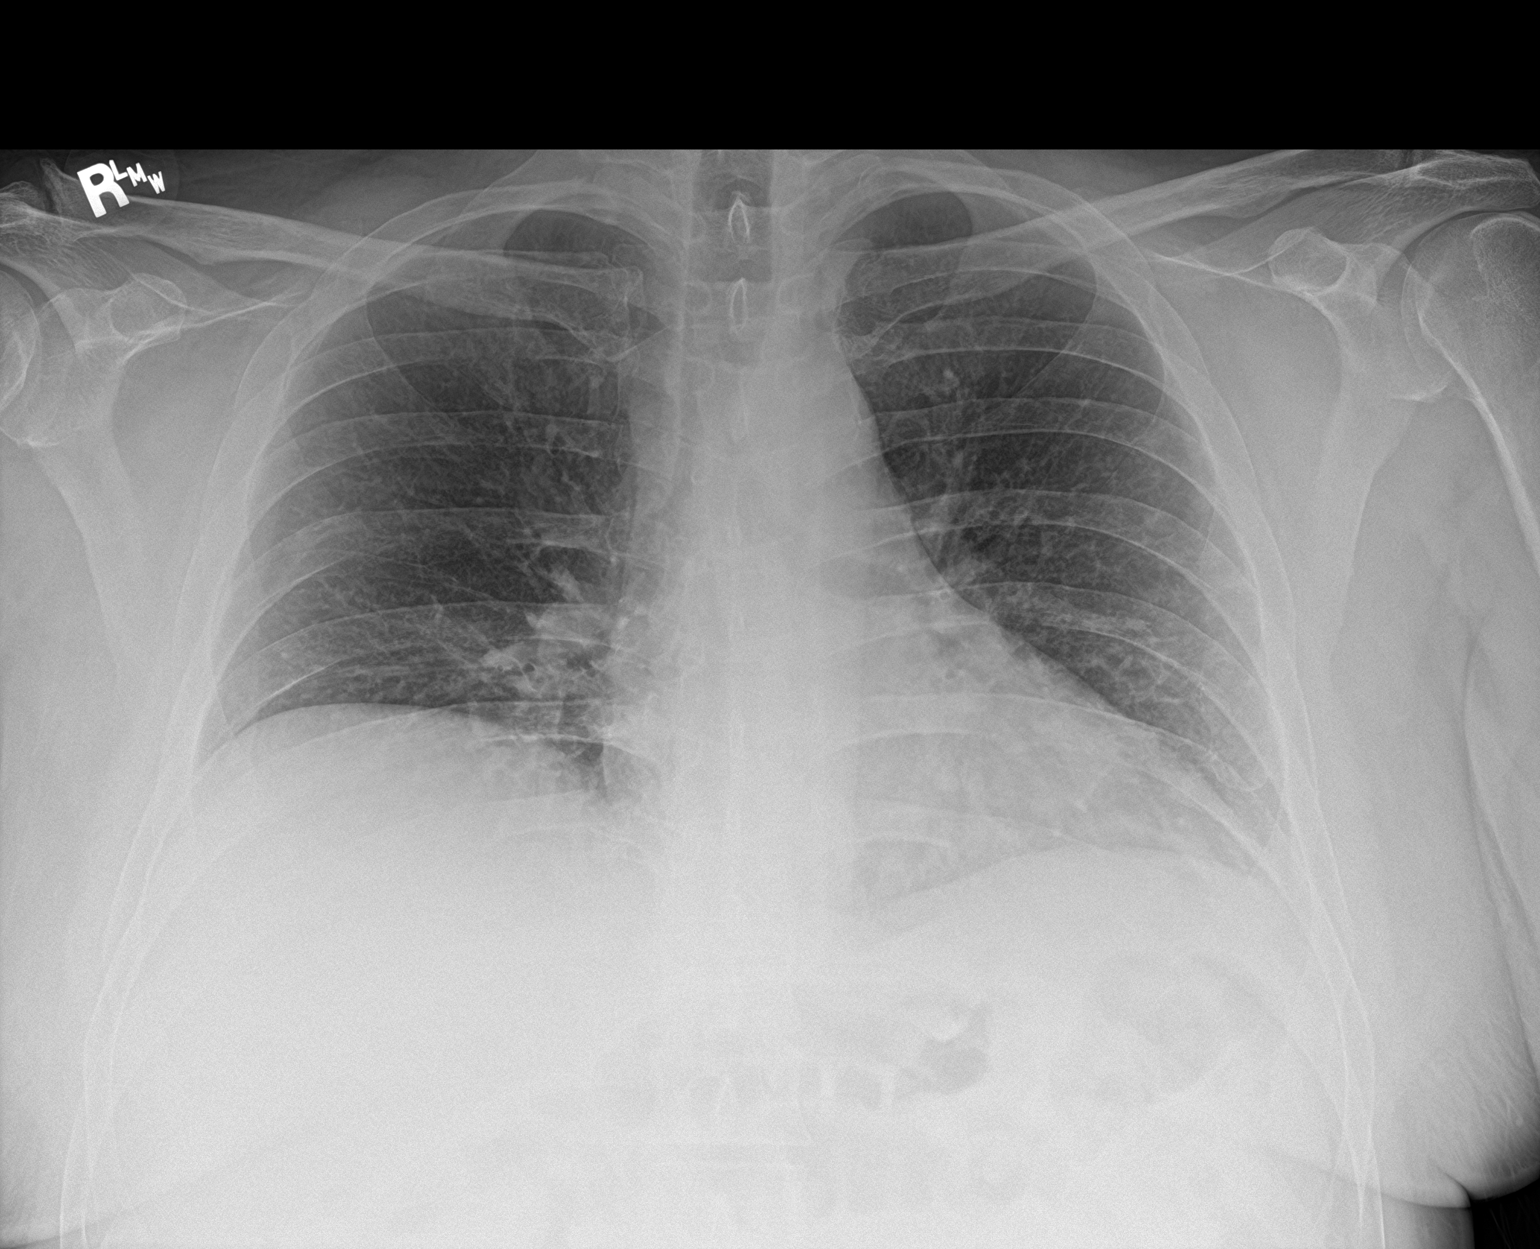

[2 of 2 positions shown; findings below may reference images not displayed]

FINDINGS: The lungs are mildly hypoexpanded. Patchy peripheral left midlung
and left basilar airspace opacities are concerning for mild
pneumonia. There is no evidence of pleural effusion or pneumothorax.

The heart is normal in size; the mediastinal contour is within
normal limits. No acute osseous abnormalities are seen.
IMPRESSION: Lungs mildly hypoexpanded. Patchy peripheral left midlung and left
basilar airspace opacities are concerning for mild pneumonia.

## 2022-02-22 DIAGNOSIS — Z419 Encounter for procedure for purposes other than remedying health state, unspecified: Secondary | ICD-10-CM | POA: Diagnosis not present
